# Patient Record
Sex: Male | Born: 2000 | Race: White | Hispanic: No | Marital: Single | Smoking: Never smoker
Health system: Southern US, Community
[De-identification: ages and names within clinical notes are randomized; demographics above are authoritative.]

## PROBLEM LIST (undated history)

## (undated) DIAGNOSIS — J45909 Unspecified asthma, uncomplicated: Secondary | ICD-10-CM

---
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
## 2021-01-03 ENCOUNTER — Encounter: Payer: Self-pay | Admitting: Emergency Medicine

## 2021-01-03 ENCOUNTER — Ambulatory Visit
Admission: EM | Admit: 2021-01-03 | Discharge: 2021-01-03 | Disposition: A | Discharge status: Home / Self Care | Payer: Medicaid Other | Attending: Internal Medicine | Admitting: Internal Medicine

## 2021-01-03 DIAGNOSIS — R200 Anesthesia of skin: Secondary | ICD-10-CM | POA: Diagnosis not present

## 2021-01-03 DIAGNOSIS — R202 Paresthesia of skin: Secondary | ICD-10-CM | POA: Diagnosis not present

## 2021-01-03 MED ORDER — METHYLPREDNISOLONE 4 MG PO TBPK
ORAL_TABLET | ORAL | 0 refills | Status: DC
Start: 2021-01-03 — End: 2021-01-07

## 2021-01-03 NOTE — Discharge Instructions (Addendum)
Avoid using your right hand on the mouse for the next week, and use the Left hand instead Follow up with your specialist next week, you may end up needing nerve conduction studies if it does not get better

## 2021-01-03 NOTE — ED Triage Notes (Signed)
Tingling and numbness in RT hand x 5 days.  Pt reports he works on his computer a lot.

## 2021-01-03 NOTE — ED Provider Notes (Signed)
[facility]    CSN: [id number] Arrival date & time: 01/03/21  1315      History   Chief Complaint No chief complaint on file.   HPI Todd [name] is a 20 y.o. male who presents with R small finger tingling and numbness x 5 days. States he has been using the mouse with this hand and working on a project using the computer for 10+ hours a day. He decided to come home last week and has been resting his R hand, and has noticed the numbness is not of the entire small finger, now is just the dorsal 1/2 distal digit.    Past Medical History:  Diagnosis Date  . Asthma     Patient Active Problem List   Diagnosis Date Noted  . WCC (well child check) 04/23/2015  . Allergic rhinitis 04/19/2013  . Mild persistent asthma 01/15/2009    Past Surgical History:  Procedure Laterality Date  . LAPAROSCOPIC APPENDECTOMY N/A 06/01/2017   Procedure: APPENDECTOMY LAPAROSCOPIC;  Surgeon: Kandice Hams, MD;  Location: [facility] OR;  Service: General;  Laterality: N/A;       Home Medications    Prior to Admission medications   Medication Sig Start Date End Date Taking? Authorizing Provider  methylPREDNISolone (MEDROL DOSEPAK) 4 MG TBPK tablet Take as directed 01/03/21  Yes Rodriguez-Southworth, Nettie Elm, PA-C  albuterol (PROAIR HFA) 108 (90 Base) MCG/ACT inhaler INHALE 2 PUFFS INTO THE LUNGS EVERY 4 (FOUR) HOURS AS NEEDED. FOR SEVERE COUGHING 09/20/20   Meccariello, Solmon Ice, DO  albuterol (PROVENTIL) (2.5 MG/3ML) 0.083% nebulizer solution Take 3 mLs (2.5 mg total) by nebulization every 6 (six) hours as needed for wheezing or shortness of breath. 06/14/19   Oralia Manis, DO  cephALEXin (KEFLEX) 500 MG capsule Take 1 capsule (500 mg total) by mouth 3 (three) times daily. 04/10/20   Blane Ohara, MD  cetirizine (ZYRTEC ALLERGY) 10 MG tablet Take 1 tablet (10 mg total) by mouth daily. 04/06/20   Avegno, Zachery Dakins, FNP  fluticasone (FLOVENT HFA) 44 MCG/ACT inhaler Inhale 2 puffs into the  lungs 2 (two) times daily. 06/14/19   Oralia Manis, DO  ibuprofen (ADVIL,MOTRIN) 800 MG tablet Take 1 tablet (800 mg total) by mouth 3 (three) times daily. Patient not taking: Reported on 04/07/2018 03/29/18   Gilda Crease, MD  Spacer/Aero-Holding Chambers (AEROCHAMBER PLUS) inhaler Use as instructed 08/02/13   Smitty Cords, DO    Family History Family History  Family history unknown: Yes    Social History Social History   Tobacco Use  . Smoking status: Never Smoker  . Smokeless tobacco: Never Used  Substance Use Topics  . Alcohol use: No  . Drug use: No     Allergies   Patient has no known allergies.   Review of Systems Review of Systems  Constitutional: Positive for activity change.  Musculoskeletal: Negative for arthralgias, joint swelling, neck pain and neck stiffness.  Skin: Negative for rash and wound.  Neurological: Positive for numbness.     Physical Exam Triage Vital Signs ED Triage Vitals  Enc Vitals Group     BP 01/03/21 1324 125/65     Pulse Rate 01/03/21 1324 83     Resp 01/03/21 1324 18     Temp 01/03/21 1324 98.2 F (36.8 C)     Temp Source 01/03/21 1324 Oral     SpO2 01/03/21 1324 97 %     Weight --      Height --  Head Circumference --      Peak Flow --      Pain Score 01/03/21 1327 3     Pain Loc --      Pain Edu? --      Excl. in GC? --    No data found.  Updated Vital Signs BP 125/65   Pulse 83   Temp 98.2 F (36.8 C) (Oral)   Resp 18   SpO2 97%   Visual Acuity Right Eye Distance:   Left Eye Distance:   Bilateral Distance:    Right Eye Near:   Left Eye Near:    Bilateral Near:     Physical Exam Vitals and nursing note reviewed.  Constitutional:      General: He is not in acute distress.    Appearance: He is normal weight. He is not toxic-appearing.  HENT:     Head: Normocephalic.     Right Ear: External ear normal.     Left Ear: External ear normal.  Eyes:     General: No scleral  icterus.    Conjunctiva/sclera: Conjunctivae normal.  Cardiovascular:     Pulses: Normal pulses.  Pulmonary:     Effort: Pulmonary effort is normal.  Musculoskeletal:        General: Normal range of motion.     Cervical back: Neck supple.     Comments: R ELBOW- not tender on lateral area or ulnar nerve region.  R HAND- normal ROM and grip strength 5/5  Skin:    General: Skin is warm and dry.     Capillary Refill: Capillary refill takes less than 2 seconds.     Findings: No rash.     Comments: Has normal sensation to light touch of his R small finger  Neurological:     Mental Status: He is alert and oriented to person, place, and time.     Gait: Gait normal.     Deep Tendon Reflexes: Reflexes normal.  Psychiatric:        Mood and Affect: Mood normal.        Behavior: Behavior normal.        Thought Content: Thought content normal.        Judgment: Judgment normal.      UC Treatments / Results  Labs (all labs ordered are listed, but only abnormal results are displayed) Labs Reviewed - No data to display  EKG   Radiology No results found.  Procedures Procedures (including critical care time)  Medications Ordered in UC Medications - No data to display  Initial Impression / Assessment and Plan / UC Course  I have reviewed the triage vital signs and the nursing notes. R small finger paresthesia possibly from over use. I placed him on a wrist splint to rest his wrist from moving laterally so much as he uses the mouse, but strongly advised to use R hand. I will have him try Medrol dose pack and see if this helps resolve the paresthesia.  Needs to FU with PCP next week.    Final Clinical Impressions(s) / UC Diagnoses   Final diagnoses:  Paresthesia of finger     Discharge Instructions     Avoid using your right hand on the mouse for the next week, and use the Left hand instead Follow up with your specialist next week, you may end up needing nerve conduction studies  if it does not get better    ED Prescriptions    Medication Sig Dispense Auth. Provider  methylPREDNISolone (MEDROL DOSEPAK) 4 MG TBPK tablet Take as directed 21 tablet Rodriguez-Southworth, Nettie Elm, PA-C     PDMP not reviewed this encounter.   Garey Ham, PA-C 01/03/21 1434

## 2021-01-07 ENCOUNTER — Telehealth: Payer: Self-pay | Admitting: Emergency Medicine

## 2021-01-07 MED ORDER — METHYLPREDNISOLONE 4 MG PO TBPK
ORAL_TABLET | ORAL | 0 refills | Status: DC
Start: 2021-01-07 — End: 2021-03-08

## 2021-03-08 ENCOUNTER — Encounter: Payer: Self-pay | Admitting: Emergency Medicine

## 2021-03-08 ENCOUNTER — Other Ambulatory Visit: Payer: Self-pay

## 2021-03-08 ENCOUNTER — Ambulatory Visit
Admission: EM | Admit: 2021-03-08 | Discharge: 2021-03-08 | Disposition: A | Discharge status: Home / Self Care | Payer: Medicaid Other | Attending: Emergency Medicine | Admitting: Emergency Medicine

## 2021-03-08 DIAGNOSIS — S0502XA Injury of conjunctiva and corneal abrasion without foreign body, left eye, initial encounter: Secondary | ICD-10-CM | POA: Diagnosis not present

## 2021-03-08 DIAGNOSIS — H18892 Other specified disorders of cornea, left eye: Secondary | ICD-10-CM

## 2021-03-08 MED ORDER — POLYMYXIN B-TRIMETHOPRIM 10000-0.1 UNIT/ML-% OP SOLN
1.0000 [drp] | Freq: Four times a day (QID) | OPHTHALMIC | 0 refills | Status: AC
Start: 2021-03-08 — End: 2021-03-15

## 2021-03-08 MED ORDER — SYSTANE OVERNIGHT THERAPY 0.3 % OP GEL: Freq: Every evening | OPHTHALMIC | 0 refills | Status: AC | PRN

## 2021-03-08 NOTE — Discharge Instructions (Addendum)
Use polytrim eye drops as prescribed and to completion Use sustane eye drops as needed for discomfort Use OTC ibuprofen or tylenol as needed for pain relief Follow up with ophthamolgy if symptoms persists or worsen such as fever, chills, redness, swelling, eye pain, painful eye movements, vision changes, etc..Marland Kitchen

## 2021-03-08 NOTE — ED Triage Notes (Signed)
Left eye redness since last night.  Eye watering.

## 2021-03-08 NOTE — ED Provider Notes (Signed)
[facility]   [id number] 03/08/21 Arrival Time: 1903  CC: Red eye  SUBJECTIVE:  Todd [name] is a 20 y.o. male who presents with complaint of eye redness, irritation and FB sensation x 1 day.  Reports black dot in eye and tried flushing and removing with finger/ wet paper towel without relief.  Symptoms are made worse with trying to remove.  Denies similar symptoms in the past.  Denies fever, chills, nausea, vomiting, eye pain, painful eye movements, itching, vision changes, periorbital erythema.     Denies contact lens use.    ROS: As per HPI.  All other pertinent ROS negative.     Past Medical History:  Diagnosis Date   Asthma    Past Surgical History:  Procedure Laterality Date   LAPAROSCOPIC APPENDECTOMY N/A 06/01/2017   Procedure: APPENDECTOMY LAPAROSCOPIC;  Surgeon: Kandice Hams, MD;  Location: [facility] OR;  Service: General;  Laterality: N/A;   No Known Allergies No current facility-administered medications on file prior to encounter.   Current Outpatient Medications on File Prior to Encounter  Medication Sig Dispense Refill   albuterol (PROAIR HFA) 108 (90 Base) MCG/ACT inhaler INHALE 2 PUFFS INTO THE LUNGS EVERY 4 (FOUR) HOURS AS NEEDED. FOR SEVERE COUGHING 18 g 2   albuterol (PROVENTIL) (2.5 MG/3ML) 0.083% nebulizer solution Take 3 mLs (2.5 mg total) by nebulization every 6 (six) hours as needed for wheezing or shortness of breath. 150 mL 1   cetirizine (ZYRTEC ALLERGY) 10 MG tablet Take 1 tablet (10 mg total) by mouth daily. 30 tablet 0   ibuprofen (ADVIL,MOTRIN) 800 MG tablet Take 1 tablet (800 mg total) by mouth 3 (three) times daily. (Patient not taking: Reported on 04/07/2018) 21 tablet 0   Spacer/Aero-Holding Chambers (AEROCHAMBER PLUS) inhaler Use as instructed 2 each 1   Social History   Socioeconomic History   Marital status: Single    Spouse name: Not on file   Number of children: Not on file   Years of education: Not on file   Highest education  level: Not on file  Occupational History   Not on file  Tobacco Use   Smoking status: Never   Smokeless tobacco: Never  Substance and Sexual Activity   Alcohol use: No   Drug use: No   Sexual activity: Not on file  Other Topics Concern   Not on file  Social History Narrative   Not on file   Social Determinants of Health   Financial Resource Strain: Not on file  Food Insecurity: Not on file  Transportation Needs: Not on file  Physical Activity: Not on file  Stress: Not on file  Social Connections: Not on file  Intimate Partner Violence: Not on file   Family History  Problem Relation Age of Onset   Healthy Mother    Healthy Father     OBJECTIVE:   Vitals:   03/08/21 1933  BP: 127/73  Pulse: 83  Resp: 16  Temp: 98.4 F (36.9 C)  TempSrc: Oral  SpO2: 97%    General appearance: alert; no distress HENT: NCAT Eyes: Trace LT conjunctival erythema. PERRL; EOMI without discomfort;  no obvious drainage; black dot in 7 o'clock position Nose: nares patent Neck: supple Lungs: normal respiratory effort Skin: warm and dry Psychological: alert and cooperative; normal mood and affect   ASSESSMENT & PLAN:  1. Abrasion of left cornea, initial encounter   2. Corneal irritation of left eye     Meds ordered this encounter  Medications  trimethoprim-polymyxin b (POLYTRIM) ophthalmic solution    Sig: Place 1 drop into the left eye 4 (four) times daily for 7 days.    Dispense:  10 mL    Refill:  0    Order Specific Question:   Supervising Provider    Answer:   Eustace Moore [[id number]]   hypromellose (SYSTANE OVERNIGHT THERAPY) 0.3 % GEL ophthalmic ointment    Sig: Place into the left eye at bedtime as needed for dry eyes.    Dispense:  10 g    Refill:  0    Order Specific Question:   Supervising Provider    Answer:   Eustace Moore [[id number]]    Use polytrim eye drops as prescribed and to completion Use sustane eye drops as needed for discomfort Use OTC  ibuprofen or tylenol as needed for pain relief Follow up with ophthamolgy if symptoms persists or worsen such as fever, chills, redness, swelling, eye pain, painful eye movements, vision changes, etc...   Reviewed expectations re: course of current medical issues. Questions answered. Outlined signs and symptoms indicating need for more acute intervention. Patient verbalized understanding. After Visit Summary given.    Rennis Harding, PA-C 03/08/21 1948

## 2021-03-12 DIAGNOSIS — T1502XA Foreign body in cornea, left eye, initial encounter: Secondary | ICD-10-CM | POA: Diagnosis not present

## 2021-03-12 DIAGNOSIS — H5712 Ocular pain, left eye: Secondary | ICD-10-CM | POA: Diagnosis not present

## 2021-04-14 ENCOUNTER — Other Ambulatory Visit: Payer: Self-pay

## 2021-04-14 ENCOUNTER — Emergency Department (HOSPITAL_COMMUNITY): Payer: Medicaid Other

## 2021-04-14 ENCOUNTER — Emergency Department (HOSPITAL_COMMUNITY)
Admission: EM | Admit: 2021-04-14 | Discharge: 2021-04-14 | Disposition: A | Discharge status: Home / Self Care | Payer: Medicaid Other | Attending: Emergency Medicine | Admitting: Emergency Medicine

## 2021-04-14 DIAGNOSIS — M79671 Pain in right foot: Secondary | ICD-10-CM | POA: Diagnosis not present

## 2021-04-14 DIAGNOSIS — J4530 Mild persistent asthma, uncomplicated: Secondary | ICD-10-CM | POA: Diagnosis not present

## 2021-04-14 DIAGNOSIS — M25571 Pain in right ankle and joints of right foot: Secondary | ICD-10-CM

## 2021-04-14 DIAGNOSIS — R2241 Localized swelling, mass and lump, right lower limb: Secondary | ICD-10-CM | POA: Insufficient documentation

## 2021-04-14 DIAGNOSIS — Y9367 Activity, basketball: Secondary | ICD-10-CM | POA: Diagnosis not present

## 2021-04-14 DIAGNOSIS — M7989 Other specified soft tissue disorders: Secondary | ICD-10-CM | POA: Diagnosis not present

## 2021-04-14 NOTE — ED Provider Notes (Signed)
[facility] EMERGENCY DEPARTMENT Provider Note   CSN: [id number] Arrival date & time: 04/14/21  2115     History Chief Complaint  Patient presents with   Ankle Pain    Right ankle rolled while playing basketball    Todd [name] is a 20 y.o. male.  HPI  Patient with no significant medical history presents to the emergency department with chief complaint of right ankle pain.  Patient states today while he was playing basketball he went up for a jump ball landed on unlevel ground and inverted his ankle.  He states he heard a popping like noise and had severe pain in his right ankle.  He states when he applies pressure on his foot he has pain, he states he has intermittent paresthesias in his toes, is able to wiggle his toes, and slightly flex and extend at his ankle but it hurts when he does so.  He denies hitting his head, losing conscious, is not on anticoagulant.  He has not taken anything for his pain, he denies alleviating factors.  He has never had this happen to him in the past.  He does not endorse fevers, chills, chest pain, shortness of breath, abdominal pain, pedal edema.  Past Medical History:  Diagnosis Date   Asthma     Patient Active Problem List   Diagnosis Date Noted   WCC (well child check) 04/23/2015   Allergic rhinitis 04/19/2013   Mild persistent asthma 01/15/2009    Past Surgical History:  Procedure Laterality Date   LAPAROSCOPIC APPENDECTOMY N/A 06/01/2017   Procedure: APPENDECTOMY LAPAROSCOPIC;  Surgeon: Kandice Hams, MD;  Location: [facility];  Service: General;  Laterality: N/A;       Family History  Problem Relation Age of Onset   Healthy Mother    Healthy Father     Social History   Tobacco Use   Smoking status: Never   Smokeless tobacco: Never  Substance Use Topics   Alcohol use: No   Drug use: No    Home Medications Prior to Admission medications   Medication Sig Start Date End Date Taking? Authorizing Provider  albuterol (PROAIR  HFA) 108 (90 Base) MCG/ACT inhaler INHALE 2 PUFFS INTO THE LUNGS EVERY 4 (FOUR) HOURS AS NEEDED. FOR SEVERE COUGHING 09/20/20   Meccariello, Solmon Ice, DO  albuterol (PROVENTIL) (2.5 MG/3ML) 0.083% nebulizer solution Take 3 mLs (2.5 mg total) by nebulization every 6 (six) hours as needed for wheezing or shortness of breath. 06/14/19   Oralia Manis, DO  cetirizine (ZYRTEC ALLERGY) 10 MG tablet Take 1 tablet (10 mg total) by mouth daily. 04/06/20   Avegno, Zachery Dakins, FNP  hypromellose (SYSTANE OVERNIGHT THERAPY) 0.3 % GEL ophthalmic ointment Place into the left eye at bedtime as needed for dry eyes. 03/08/21   Wurst, [place], PA-C  ibuprofen (ADVIL,MOTRIN) 800 MG tablet Take 1 tablet (800 mg total) by mouth 3 (three) times daily. Patient not taking: Reported on 04/07/2018 03/29/18   Gilda Crease, MD  Spacer/Aero-Holding Chambers (AEROCHAMBER PLUS) inhaler Use as instructed 08/02/13   Smitty Cords, DO    Allergies    Patient has no known allergies.  Review of Systems   Review of Systems  Constitutional:  Negative for chills and fever.  HENT:  Negative for congestion.   Respiratory:  Negative for shortness of breath.   Cardiovascular:  Negative for chest pain.  Gastrointestinal:  Negative for abdominal pain.  Genitourinary:  Negative for enuresis.  Musculoskeletal:  Negative for back  pain.       Right ankle pain.  Skin:  Negative for rash.  Neurological:  Negative for dizziness.  Hematological:  Does not bruise/bleed easily.   Physical Exam Updated Vital Signs BP 127/78   Pulse 95   Temp 99 F (37.2 C) (Oral)   Resp 16   Ht 6\' 1"  (1.854 m)   Wt 86.2 kg   SpO2 98%   BMI 25.07 kg/m   Physical Exam Vitals and nursing note reviewed.  Constitutional:      General: He is not in acute distress.    Appearance: Normal appearance. He is not ill-appearing or diaphoretic.  HENT:     Head: Normocephalic and atraumatic.     Nose: No congestion.  Eyes:      Conjunctiva/sclera: Conjunctivae normal.  Cardiovascular:     Rate and Rhythm: Normal rate and regular rhythm.  Pulmonary:     Effort: Pulmonary effort is normal.     Breath sounds: Normal breath sounds.  Musculoskeletal:        General: No deformity.     Cervical back: Neck supple.     Right lower leg: Edema present.     Left lower leg: No edema.     Comments: Patient's right ankle is visualized there is no gross deformities present, he has noted edema around the lateral malleolus, he has full range of motion at his toes, limited range of motion with flexion and extension at the ankle likely due to pain, he had slight tenderness to palpation on the proximal end of the third and fourth metatarsals, no gross deformities present, he is tender on his lateral malleolus no gross deformities present, Achilles tendon was fully intact nontender to palpation.  Skin:    General: Skin is warm and dry.     Coloration: Skin is not jaundiced or pale.  Neurological:     Mental Status: He is alert and oriented to person, place, and time.  Psychiatric:        Mood and Affect: Mood normal.    ED Results / Procedures / Treatments   Labs (all labs ordered are listed, but only abnormal results are displayed) Labs Reviewed - No data to display  EKG None  Radiology DG Ankle Complete Right  Result Date: 04/14/2021 CLINICAL DATA:  Patient rolled/inverted his right ankle tonight. Unable to bear weight. EXAM: RIGHT ANKLE - COMPLETE 3+ VIEW COMPARISON:  None. FINDINGS: No fracture or bone lesion. Ankle joint normally spaced and aligned. Lateral soft tissue swelling. IMPRESSION: 1. No fracture or dislocation. Electronically Signed   By: 04/16/2021 M.D.   On: 04/14/2021 22:32   DG Foot Complete Right  Result Date: 04/14/2021 CLINICAL DATA:  Third and fourth metatarsal pain after rolling ankle. EXAM: RIGHT FOOT COMPLETE - 3+ VIEW COMPARISON:  Contemporary ankle radiograph 04/14/2021 FINDINGS: No clear acute  fracture or traumatic osseous injury is identified. No gross traumatic malalignment within the limitations of a nonweightbearing exam. Well corticated accessory navicular. Lateral ankle swelling is seen, better assessed on dedicated radiographs of the ankle performed the same day. No other soft tissue abnormality. No soft tissue gas or foreign body. IMPRESSION: No clear acute fracture or traumatic malalignment within the limitations of a nonweightbearing exam. Lateral ankle swelling. Electronically Signed   By: 04/16/2021 M.D.   On: 04/14/2021 23:05    Procedures Procedures   Medications Ordered in ED Medications - No data to display  ED Course  I have reviewed the triage  vital signs and the nursing notes.  Pertinent labs & imaging results that were available during my care of the patient were reviewed by me and considered in my medical decision making (see chart for details).    MDM Rules/Calculators/A&P                          Initial impression-patient presents with right ankle pain.  He is alert, does not appear in distress, vital signs reassuring.  Will obtain imaging of his foot and ankle.  Work-up-x-ray of foot is unremarkable, DG foot negative for acute findings.  Rule out- I have low suspicion for septic arthritis as patient denies IV drug use, skin exam was performed no erythematous, edematous, warm joints noted on exam, no new heart murmur heard on exam.  Low suspicion for fracture or dislocation as x-ray does not feel any significant findings. low suspicion for ligament or tendon damage as area was palpated no gross defects noted, they had full range of motion as well as 5/5 strength.  Low suspicion for compartment syndrome as area was palpated it was soft to the touch, neurovascular fully intact.  Plan- Right ankle pain-suspect this is a muscular strain, possibly could be ligament damage will place him in a brace, make him nonweightbearing, follow-up with orthopedic surgery  for further evaluation.  Vital signs have remained stable, no indication for hospital admission.   Patient given at home care as well strict return precautions.  Patient verbalized that they understood agreed to said plan.  Final Clinical Impression(s) / ED Diagnoses Final diagnoses:  Acute right ankle pain    Rx / DC Orders ED Discharge Orders     None        Barnie Del 04/14/21 2335    Bethann Berkshire, MD 04/15/21 1042

## 2021-04-14 NOTE — Discharge Instructions (Addendum)
Imaging was negative, I placed you in a brace please wear during the day you may take off at nighttime.  Please use crutches as I do not want to be weightbearing.  Recommend over-the-counter pain medications.  Please keep the foot elevated, apply ice to area to help with swelling.  Please follow-up with orthopedic surgery for further evaluation.  Come back to the emergency department if you develop chest pain, shortness of breath, severe abdominal pain, uncontrolled nausea, vomiting, diarrhea.

## 2021-04-15 ENCOUNTER — Telehealth: Payer: Self-pay

## 2021-04-15 DIAGNOSIS — M25571 Pain in right ankle and joints of right foot: Secondary | ICD-10-CM | POA: Diagnosis not present

## 2021-04-15 NOTE — Telephone Encounter (Signed)
Transition Care Management Follow-up Telephone Call Date of discharge and from where: 04/14/2021[facility]  How have you been since you were released from the hospital? Pain this morning but Tylenol helps.  Any questions or concerns? No  Items Reviewed: Did the pt receive and understand the discharge instructions provided? Yes  Medications obtained and verified?  No medications given at discharge.  Other? No  Any new allergies since your discharge? No  Dietary orders reviewed? No Do you have support at home? Yes   Home Care and Equipment/Supplies: Were home health services ordered? not applicable If so, what is the name of the agency? N/A  Has the agency set up a time to come to the patient's home? not applicable Were any new equipment or medical supplies ordered?  No What is the name of the medical supply agency? N/A Were you able to get the supplies/equipment? not applicable Do you have any questions related to the use of the equipment or supplies? No  Functional Questionnaire: (I = Independent and D = Dependent) ADLs: I  Bathing/Dressing- I  Meal Prep- I  Eating- I  Maintaining continence- I  Transferring/Ambulation- I  Managing Meds- I  Follow up appointments reviewed:  PCP Hospital f/u appt confirmed? No   Specialist Hospital f/u appt confirmed? No  Patient will schedule with Dr. Romeo Apple. Are transportation arrangements needed? No  If their condition worsens, is the pt aware to call PCP or go to the Emergency Dept.? Yes Was the patient provided with contact information for the PCP's office or ED? Yes Was to pt encouraged to call back with questions or concerns? Yes

## 2021-04-16 DIAGNOSIS — M25571 Pain in right ankle and joints of right foot: Secondary | ICD-10-CM | POA: Diagnosis not present

## 2021-05-21 DIAGNOSIS — M25571 Pain in right ankle and joints of right foot: Secondary | ICD-10-CM | POA: Diagnosis not present

## 2021-05-27 ENCOUNTER — Other Ambulatory Visit: Payer: Self-pay

## 2021-05-27 ENCOUNTER — Encounter: Payer: Self-pay | Admitting: Emergency Medicine

## 2021-05-27 ENCOUNTER — Ambulatory Visit
Admission: EM | Admit: 2021-05-27 | Discharge: 2021-05-27 | Disposition: A | Discharge status: Home / Self Care | Payer: Medicaid Other | Attending: Family Medicine | Admitting: Family Medicine

## 2021-05-27 DIAGNOSIS — H60391 Other infective otitis externa, right ear: Secondary | ICD-10-CM

## 2021-05-27 MED ORDER — NEOMYCIN-POLYMYXIN-HC 3.5-10000-1 OT SUSP: 3.0000 [drp] | Freq: Three times a day (TID) | OTIC | 0 refills | Status: AC

## 2021-05-27 NOTE — ED Provider Notes (Signed)
[facility]    CSN: [id number] Arrival date & time: 05/27/21  1858      History   Chief Complaint No chief complaint on file.   HPI Todd [name] is a 20 y.o. male.   HPI  Patient in today with right ear pain following tubing times a few days ago.  Patient is afebrile.  Endorses no associated URI symptoms. No history of recurrent ear infections. Past Medical History:  Diagnosis Date   Asthma     Patient Active Problem List   Diagnosis Date Noted   WCC (well child check) 04/23/2015   Allergic rhinitis 04/19/2013   Mild persistent asthma 01/15/2009    Past Surgical History:  Procedure Laterality Date   LAPAROSCOPIC APPENDECTOMY N/A 06/01/2017   Procedure: APPENDECTOMY LAPAROSCOPIC;  Surgeon: Kandice Hams, MD;  Location: [facility] OR;  Service: General;  Laterality: N/A;       Home Medications    Prior to Admission medications   Medication Sig Start Date End Date Taking? Authorizing Provider  albuterol (PROAIR HFA) 108 (90 Base) MCG/ACT inhaler INHALE 2 PUFFS INTO THE LUNGS EVERY 4 (FOUR) HOURS AS NEEDED. FOR SEVERE COUGHING 09/20/20   Meccariello, Solmon Ice, DO  albuterol (PROVENTIL) (2.5 MG/3ML) 0.083% nebulizer solution Take 3 mLs (2.5 mg total) by nebulization every 6 (six) hours as needed for wheezing or shortness of breath. 06/14/19   Oralia Manis, DO  cetirizine (ZYRTEC ALLERGY) 10 MG tablet Take 1 tablet (10 mg total) by mouth daily. 04/06/20   Avegno, Zachery Dakins, FNP  hypromellose (SYSTANE OVERNIGHT THERAPY) 0.3 % GEL ophthalmic ointment Place into the left eye at bedtime as needed for dry eyes. 03/08/21   Wurst, [place], PA-C  ibuprofen (ADVIL,MOTRIN) 800 MG tablet Take 1 tablet (800 mg total) by mouth 3 (three) times daily. Patient not taking: Reported on 04/07/2018 03/29/18   Gilda Crease, MD  Spacer/Aero-Holding Chambers (AEROCHAMBER PLUS) inhaler Use as instructed 08/02/13   Smitty Cords, DO    Family History Family History   Problem Relation Age of Onset   Healthy Mother    Healthy Father     Social History Social History   Tobacco Use   Smoking status: Never   Smokeless tobacco: Never  Substance Use Topics   Alcohol use: No   Drug use: No     Allergies   Patient has no known allergies.   Review of Systems Review of Systems Pertinent negatives listed in HPI   Physical Exam Triage Vital Signs ED Triage Vitals [05/27/21 1903]  Enc Vitals Group     BP 136/84     Pulse Rate 85     Resp 16     Temp 98.6 F (37 C)     Temp Source Oral     SpO2 96 %     Weight      Height      Head Circumference      Peak Flow      Pain Score 8     Pain Loc      Pain Edu?      Excl. in GC?    No data found.  Updated Vital Signs BP 136/84 (BP Location: Right Arm)   Pulse 85   Temp 98.6 F (37 C) (Oral)   Resp 16   SpO2 96%   Visual Acuity Right Eye Distance:   Left Eye Distance:   Bilateral Distance:    Right Eye Near:   Left Eye Near:  Bilateral Near:     Physical Exam  General Appearance:    Alert, cooperative, no distress  HENT:  Normocephalic, left ear tragus tenderness with erythema,   right ear normal, no neck nodes or sinus tenderness  Eyes:    PERRL, conjunctiva/corneas clear, EOM's intact       Lungs:     Clear to auscultation bilaterally, respirations unlabored  Heart:    Regular rate and rhythm  Neurologic:   Awake, alert, oriented x 3. No apparent focal neurological           defect.        UC Treatments / Results  Labs (all labs ordered are listed, but only abnormal results are displayed) Labs Reviewed - No data to display  EKG   Radiology No results found.  Procedures Procedures (including critical care time)  Medications Ordered in UC Medications - No data to display  Initial Impression / Assessment and Plan / UC Course  I have reviewed the triage vital signs and the nursing notes.  Pertinent labs & imaging results that were available during my  care of the patient were reviewed by me and considered in my medical decision making (see chart for details).     Infective otitis externa right ear  Treatment per discharge medication orders. RTC PRN Final Clinical Impressions(s) / UC Diagnoses   Final diagnoses:  Infective otitis externa of right ear   Discharge Instructions   None    ED Prescriptions     Medication Sig Dispense Auth. Provider   neomycin-polymyxin-hydrocortisone (CORTISPORIN) 3.5-10000-1 OTIC suspension Place 3 drops into the right ear 3 (three) times daily for 7 days. 5 mL Bing Neighbors, FNP      PDMP not reviewed this encounter.   Bing Neighbors, FNP 05/27/21 1924

## 2021-05-27 NOTE — ED Triage Notes (Signed)
Right ear pain after tubing in water today.

## 2021-06-29 NOTE — Progress Notes (Signed)
    Routine Well Visit  PCP: Sabino Dick, DO   History was provided by the patient.  Todd [name] is a 20 y.o. male who is here for a routine physical examination.  Current concerns: Would like Flu Shot   Adolescent Assessment:  Confidentiality was discussed with the patient and if applicable, with caregiver as well.  Home and Environment:  Lives with: lives at home with his mother, father and younger sister. Parental relations: Good Friends/Peers: Good group of friends Nutrition/Eating Behaviors: Eats lots of protein. Balanced meals. Sports/Exercise:  Secondary school teacher for fun, gym.   Education and Employment:  School Status: not in school currently, taking a gap semester. Goes to [employer] and majoring in Media planner. School History: School attendance is regular. Work: Regulatory affairs officer, Chartered certified accountant business, manages a [employer], builds houses, [employer] Activities: Listens to Acupuncturist.  Patient reports being comfortable and safe at school and at home? Yes  Smoking: no Secondhand smoke exposure? no Drugs/EtOH: None   Sexuality:  - sexual partners in last year:  0 - contraception use:  Not sexually active yet but does have condoms when ready. - Last STI Screening: No - Violence/Abuse: No  Mood: Suicidality and Depression: None, feels happy.  Weapons: Has guns at home but they are locked in a gun safe.  PHQ-9 completed and results indicated: 0  Physical Exam:  BP 133/79   Pulse 90   Ht 6\' 1"  (1.854 m)   Wt 196 lb (88.9 kg)   SpO2 100%   BMI 25.86 kg/m  Blood pressure percentiles are not available for patients who are 18 years or older.   General Appearance:   alert, oriented, no acute distress and well nourished  HENT: Normocephalic, no obvious abnormality, PERRL, EOM's intact, conjunctiva clear  Mouth:   Normal appearing teeth, no obvious discoloration, dental caries, or dental caps  Neck:   Supple; thyroid:  no enlargement, symmetric, no tenderness/mass/nodules  Lungs:   Clear to auscultation bilaterally, normal work of breathing  Heart:   Regular rate and rhythm, S1 and S2 normal, no murmurs;   Abdomen:   Soft, non-tender, no mass, or organomegaly  GU genitalia not examined  Musculoskeletal:   Tone and strength strong and symmetrical, all extremities               Lymphatic:   No cervical adenopathy  Skin/Hair/Nails:   Skin warm, dry and intact, no rashes, no bruises or petechiae  Neurologic:   Strength, gait, and coordination normal and age-appropriate    Assessment/Plan: Todd [name] is a healthy 20 y.o. male who presents today for annual examination. Examination without abnormalities. BMI is 25 which is in the overweight category but patient is very active and has been lifting weights and trying to develop more muscle mass. His blood pressure was mildly elevated at 133/79 today. We discussed lowering salt content in food, continuing to exercise and monitoring his pressures at home with his mother's blood pressure cuff. This was likely more-so related to nervousness in the office. Can monitor for now and focus on lifestyle interventions as that would be first-line treatment anyway.  Immunizations today: Flu History of previous adverse reactions to immunizations? No  - Follow-up visit in 1 year for next visit, or sooner as needed.   12, DO

## 2021-07-02 ENCOUNTER — Encounter: Payer: Self-pay | Admitting: Family Medicine

## 2021-07-02 ENCOUNTER — Other Ambulatory Visit: Payer: Self-pay

## 2021-07-02 ENCOUNTER — Ambulatory Visit (INDEPENDENT_AMBULATORY_CARE_PROVIDER_SITE_OTHER): Payer: Medicaid Other | Admitting: Family Medicine

## 2021-07-02 VITALS — BP 133/79 | HR 90 | Ht 73.0 in | Wt 196.0 lb

## 2021-07-02 DIAGNOSIS — Z23 Encounter for immunization: Secondary | ICD-10-CM

## 2021-07-02 DIAGNOSIS — Z0000 Encounter for general adult medical examination without abnormal findings: Secondary | ICD-10-CM

## 2021-07-02 NOTE — Patient Instructions (Signed)
It was wonderful to see you today.  Please bring ALL of your medications with you to every visit.   Today we talked about:  -You received your Flu shot today. -Check your blood pressure at home. If it is persistently elevated >130/80, let us know. The best thing you can do for this is continue to exercise and limit the salt in your diet. -Today at your annual preventive visit we talked about the following measures: I recommend 150 minutes of exercise per week-try 30 minutes 5 days per week We discussed reducing sugary beverages (like soda and juice) and increasing leafy greens and whole fruits.  We discussed avoiding tobacco and alcohol.  I recommend avoiding illicit substances.    Thank you for choosing [facility].   Please call [phone] with any questions about today's appointment.  Please be sure to schedule follow up at the front  desk before you leave today.   Sabino Dick, DO PGY-2 Family Medicine

## 2021-08-01 ENCOUNTER — Other Ambulatory Visit: Payer: Self-pay

## 2021-08-01 ENCOUNTER — Encounter (HOSPITAL_BASED_OUTPATIENT_CLINIC_OR_DEPARTMENT_OTHER): Payer: Self-pay | Admitting: Emergency Medicine

## 2021-08-01 ENCOUNTER — Other Ambulatory Visit (HOSPITAL_BASED_OUTPATIENT_CLINIC_OR_DEPARTMENT_OTHER): Payer: Self-pay

## 2021-08-01 ENCOUNTER — Emergency Department (HOSPITAL_BASED_OUTPATIENT_CLINIC_OR_DEPARTMENT_OTHER)
Admission: EM | Admit: 2021-08-01 | Discharge: 2021-08-01 | Disposition: A | Discharge status: Home / Self Care | Payer: Medicaid Other | Attending: Emergency Medicine | Admitting: Emergency Medicine

## 2021-08-01 ENCOUNTER — Emergency Department (HOSPITAL_BASED_OUTPATIENT_CLINIC_OR_DEPARTMENT_OTHER): Payer: Medicaid Other

## 2021-08-01 DIAGNOSIS — Z20822 Contact with and (suspected) exposure to covid-19: Secondary | ICD-10-CM | POA: Diagnosis not present

## 2021-08-01 DIAGNOSIS — R410 Disorientation, unspecified: Secondary | ICD-10-CM | POA: Insufficient documentation

## 2021-08-01 DIAGNOSIS — J45909 Unspecified asthma, uncomplicated: Secondary | ICD-10-CM | POA: Diagnosis not present

## 2021-08-01 DIAGNOSIS — R0789 Other chest pain: Secondary | ICD-10-CM | POA: Diagnosis not present

## 2021-08-01 DIAGNOSIS — R079 Chest pain, unspecified: Secondary | ICD-10-CM | POA: Diagnosis not present

## 2021-08-01 LAB — RESP PANEL BY RT-PCR (FLU A&B, COVID) ARPGX2
Influenza A by PCR: NEGATIVE
Influenza B by PCR: NEGATIVE
SARS Coronavirus 2 by RT PCR: NEGATIVE

## 2021-08-01 MED ORDER — KETOROLAC TROMETHAMINE 30 MG/ML IJ SOLN
30.0000 mg | Freq: Once | INTRAMUSCULAR | Status: AC
  Administered 2021-08-01: 30 mg via INTRAMUSCULAR
  Filled 2021-08-01: qty 1

## 2021-08-01 MED ORDER — IBUPROFEN 600 MG PO TABS
600.0000 mg | ORAL_TABLET | Freq: Four times a day (QID) | ORAL | 0 refills | Status: AC | PRN
Start: 2021-08-01 — End: ?
  Filled 2021-08-01: qty 30, 8d supply, fill #0

## 2021-08-01 NOTE — ED Provider Notes (Signed)
[facility] EMERGENCY DEPARTMENT Provider Note   CSN: [id number] Arrival date & time: 08/01/21  4034     History Chief Complaint  Patient presents with   Chest Pain    Todd [name] is a 20 y.o. male.  Pt presents to the ED today with left sided cp.  It started around 0630 this am.  Pt denies any injury.  It hurts when he moves and when it is touched.  No other associated sx.      Past Medical History:  Diagnosis Date   Asthma     Patient Active Problem List   Diagnosis Date Noted   WCC (well child check) 04/23/2015   Allergic rhinitis 04/19/2013   Mild persistent asthma 01/15/2009    Past Surgical History:  Procedure Laterality Date   LAPAROSCOPIC APPENDECTOMY N/A 06/01/2017   Procedure: APPENDECTOMY LAPAROSCOPIC;  Surgeon: Kandice Hams, MD;  Location: [facility] OR;  Service: General;  Laterality: N/A;       Family History  Problem Relation Age of Onset   Healthy Mother    Healthy Father     Social History   Tobacco Use   Smoking status: Never   Smokeless tobacco: Never  Substance Use Topics   Alcohol use: No   Drug use: No    Home Medications Prior to Admission medications   Medication Sig Start Date End Date Taking? Authorizing Provider  ibuprofen (ADVIL) 600 MG tablet Take 1 tablet (600 mg total) by mouth every 6 (six) hours as needed. 08/01/21  Yes Jacalyn Lefevre, MD  albuterol (PROAIR HFA) 108 (90 Base) MCG/ACT inhaler INHALE 2 PUFFS INTO THE LUNGS EVERY 4 (FOUR) HOURS AS NEEDED. FOR SEVERE COUGHING 09/20/20   Meccariello, Solmon Ice, DO  albuterol (PROVENTIL) (2.5 MG/3ML) 0.083% nebulizer solution Take 3 mLs (2.5 mg total) by nebulization every 6 (six) hours as needed for wheezing or shortness of breath. Patient not taking: Reported on 07/02/2021 06/14/19   Oralia Manis, DO  cetirizine (ZYRTEC ALLERGY) 10 MG tablet Take 1 tablet (10 mg total) by mouth daily. Patient not taking: Reported on 07/02/2021 04/06/20   Durward Parcel, FNP   hypromellose (SYSTANE OVERNIGHT THERAPY) 0.3 % GEL ophthalmic ointment Place into the left eye at bedtime as needed for dry eyes. Patient not taking: Reported on 07/02/2021 03/08/21   Alvino Chapel [place], PA-C  Spacer/Aero-Holding Chambers (AEROCHAMBER PLUS) inhaler Use as instructed Patient not taking: Reported on 07/02/2021 08/02/13   Smitty Cords, DO    Allergies    Patient has no known allergies.  Review of Systems   Review of Systems  Cardiovascular:  Positive for chest pain.  All other systems reviewed and are negative.  Physical Exam Updated Vital Signs BP 139/60 (BP Location: Right Arm)   Pulse 94   Temp 98 F (36.7 C) (Oral)   Resp 14   Ht 6\' 1"  (1.854 m)   Wt 83.9 kg   SpO2 100%   BMI 24.41 kg/m   Physical Exam Vitals and nursing note reviewed.  Constitutional:      Appearance: He is well-developed.  HENT:     Head: Normocephalic and atraumatic.  Eyes:     Extraocular Movements: Extraocular movements intact.     Pupils: Pupils are equal, round, and reactive to light.  Cardiovascular:     Rate and Rhythm: Normal rate and regular rhythm.     Heart sounds: Normal heart sounds.  Pulmonary:     Effort: Pulmonary effort is normal.  Breath sounds: Normal breath sounds.  Chest:    Abdominal:     General: Bowel sounds are normal.     Palpations: Abdomen is soft.  Musculoskeletal:        General: Normal range of motion.     Cervical back: Normal range of motion and neck supple.  Skin:    General: Skin is warm.     Capillary Refill: Capillary refill takes less than 2 seconds.  Neurological:     Mental Status: He is alert. He is disoriented.  Psychiatric:        Mood and Affect: Mood normal.        Behavior: Behavior normal.    ED Results / Procedures / Treatments   Labs (all labs ordered are listed, but only abnormal results are displayed) Labs Reviewed  RESP PANEL BY RT-PCR (FLU A&B, COVID) [id number]    EKG EKG  Interpretation  Date/Time:  Thursday August 01 2021 08:32:40 EST Ventricular Rate:  80 PR Interval:  150 QRS Duration: 104 QT Interval:  340 QTC Calculation: 392 R Axis:   76 Text Interpretation: Normal sinus rhythm Normal ECG No old tracing to compare Confirmed by Jacalyn Lefevre [phone]) on 08/01/2021 9:17:49 AM  Radiology DG Chest 2 View  Result Date: 08/01/2021 CLINICAL DATA:  Left-sided chest pain beginning today upon wakening. EXAM: CHEST - 2 VIEW COMPARISON:  11/08/2018 FINDINGS: Heart size is normal. Mediastinal shadows are normal. The lungs are clear. No bronchial thickening. No infiltrate, mass, effusion or collapse. Pulmonary vascularity is normal. No bony abnormality. IMPRESSION: Normal chest Electronically Signed   By: Paulina Fusi M.D.   On: 08/01/2021 09:37    Procedures Procedures   Medications Ordered in ED Medications  ketorolac (TORADOL) 30 MG/ML injection 30 mg (30 mg Intramuscular Given 08/01/21 0948)    ED Course  I have reviewed the triage vital signs and the nursing notes.  Pertinent labs & imaging results that were available during my care of the patient were reviewed by me and considered in my medical decision making (see chart for details).    MDM Rules/Calculators/A&P                           Pt's pain is pleuritic.  He has a nl ekg and cxr.  Covid/flu neg.  Pt is stable for d/c.  Return if worse.  F/u with pcp. Final Clinical Impression(s) / ED Diagnoses Final diagnoses:  Chest wall pain    Rx / DC Orders ED Discharge Orders          Ordered    ibuprofen (ADVIL) 600 MG tablet  Every 6 hours PRN        08/01/21 1038             Jacalyn Lefevre, MD 08/01/21 1039

## 2021-08-01 NOTE — ED Triage Notes (Signed)
Pt arrivves pov with c/o radiating left side CP to left arm that started ~ 0630 today upon waking. Pt denies n/v, shob or cough. Denies injury.

## 2021-08-01 NOTE — ED Notes (Signed)
Patient transported to X-ray via wheelchair 

## 2021-08-01 NOTE — ED Notes (Signed)
Pt transported to XR.  

## 2021-08-02 ENCOUNTER — Telehealth: Payer: Self-pay

## 2021-08-02 NOTE — Telephone Encounter (Signed)
Transition Care Management Follow-up Telephone Call Date of discharge and from where: 08/01/2021-[facility] How have you been since you were released from the hospital? Patient stated he is doing fine.  Any questions or concerns? No  Items Reviewed: Did the pt receive and understand the discharge instructions provided? Yes  Medications obtained and verified? Yes  Other? No  Any new allergies since your discharge? No  Dietary orders reviewed? No Do you have support at home? Yes   Home Care and Equipment/Supplies: Were home health services ordered? not applicable If so, what is the name of the agency? N/A  Has the agency set up a time to come to the patient's home? not applicable Were any new equipment or medical supplies ordered?  No What is the name of the medical supply agency? N/A Were you able to get the supplies/equipment? not applicable Do you have any questions related to the use of the equipment or supplies? No  Functional Questionnaire: (I = Independent and D = Dependent) ADLs: I  Bathing/Dressing- I  Meal Prep- I  Eating- I  Maintaining continence- I  Transferring/Ambulation- I  Managing Meds- I  Follow up appointments reviewed:  PCP Hospital f/u appt confirmed? No   Specialist Hospital f/u appt confirmed? No   Are transportation arrangements needed? No  If their condition worsens, is the pt aware to call PCP or go to the Emergency Dept.? Yes Was the patient provided with contact information for the PCP's office or ED? Yes Was to pt encouraged to call back with questions or concerns? Yes

## 2021-08-12 ENCOUNTER — Other Ambulatory Visit (HOSPITAL_BASED_OUTPATIENT_CLINIC_OR_DEPARTMENT_OTHER): Payer: Self-pay

## 2022-02-25 ENCOUNTER — Encounter: Payer: Self-pay | Admitting: *Deleted

## 2022-09-09 ENCOUNTER — Other Ambulatory Visit: Payer: Self-pay

## 2022-09-09 DIAGNOSIS — J4531 Mild persistent asthma with (acute) exacerbation: Secondary | ICD-10-CM

## 2022-09-09 MED ORDER — ALBUTEROL SULFATE HFA 108 (90 BASE) MCG/ACT IN AERS: INHALATION_SPRAY | RESPIRATORY_TRACT | 2 refills | Status: DC

## 2023-02-10 ENCOUNTER — Encounter: Payer: Self-pay | Admitting: Family Medicine

## 2023-02-10 ENCOUNTER — Other Ambulatory Visit: Payer: Self-pay

## 2023-02-10 ENCOUNTER — Ambulatory Visit (INDEPENDENT_AMBULATORY_CARE_PROVIDER_SITE_OTHER): Payer: Medicaid Other | Admitting: Family Medicine

## 2023-02-10 VITALS — BP 129/60 | HR 77 | Ht 72.5 in | Wt 215.2 lb

## 2023-02-10 DIAGNOSIS — R0989 Other specified symptoms and signs involving the circulatory and respiratory systems: Secondary | ICD-10-CM | POA: Diagnosis not present

## 2023-02-10 DIAGNOSIS — J4531 Mild persistent asthma with (acute) exacerbation: Secondary | ICD-10-CM

## 2023-02-10 DIAGNOSIS — Z0000 Encounter for general adult medical examination without abnormal findings: Secondary | ICD-10-CM

## 2023-02-10 MED ORDER — ALBUTEROL SULFATE HFA 108 (90 BASE) MCG/ACT IN AERS: INHALATION_SPRAY | RESPIRATORY_TRACT | 2 refills | Status: AC

## 2023-02-10 MED ORDER — ALBUTEROL SULFATE (2.5 MG/3ML) 0.083% IN NEBU: 2.5000 mg | INHALATION_SOLUTION | Freq: Four times a day (QID) | RESPIRATORY_TRACT | 1 refills | Status: AC | PRN

## 2023-02-10 NOTE — Patient Instructions (Signed)
It was wonderful to see you today.  Please bring ALL of your medications with you to every visit.   Today we talked about:  Today at your annual preventive visit we talked about the following measures I recommend 150 minutes of exercise per week-try 30 minutes 5 days per week We discussed reducing sugary beverages (like soda and juice) and increasing leafy greens and whole fruits.  We discussed avoiding tobacco and alcohol.  I recommend avoiding illicit substances.  Your blood pressure is at goal of <140/90.   I have refilled your albuterol inhaler and nebulizer solution.  Thank you for coming to your visit as scheduled. We have had a large "no-show" problem lately, and this significantly limits our ability to see and care for patients. As a friendly reminder- if you cannot make your appointment please call to cancel. We do have a no show policy for those who do not cancel within 24 hours. Our policy is that if you miss or fail to cancel an appointment within 24 hours, 3 times in a 22-month period, you may be dismissed from our clinic.   Thank you for choosing [facility].   Please call [phone] with any questions about today's appointment.  Please be sure to schedule follow up at the front  desk before you leave today.   Sabino Dick, DO PGY-3 Family Medicine

## 2023-02-10 NOTE — Progress Notes (Signed)
SUBJECTIVE:   Chief compliant/HPI: annual examination  Todd [name] is a 22 y.o. who presents today for an annual exam.  Current concerns: would like refill on inhaler and nebulizer medication. Uses rarely but would like to have it on hand just in case.  At end of the visit he also stated that he lately has noticed that when the room is quiet he notices that he clears his voice often.  He is unsure if anything is wrong.  Only seems to happen during meetings when there is a silence. Feels slightly embarrassed by this.   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  Lives in his own house.  Denies drug use or alcohol.  Exercises for about an hour each morning, very active at his job.  Diet is well balanced.   Denies any family history.   OBJECTIVE:  BP 129/60   Pulse 77   Ht 6' 0.5" (1.842 m)   Wt 215 lb 4 oz (97.6 kg)   SpO2 99%   BMI 28.79 kg/m   General: Awake, alert, oriented, in no acute distress, pleasant and cooperative with examination HEENT: Normocephalic, atraumatic, nares patent, dentition is good, oropharynx without erythema or exudates, TM's clear bilaterally, no thyroid nodules palpated Cardio: RRR without murmur, 2+ radial, DP and PT pulses b/l Respiratory: CTAB without wheezing/rhonchi/rales Abdomen: Soft, non-tender to palpation of all quadrants, non-distended, no rebound/guarding, no organomegaly MSK: Able to move all extremities spontaneously, good muscle strength, no abnormalities Extremities: without edema or cyanosis Neuro: Speech is clear and intact, no focal deficits, no facial asymmetry, follows commands  Psych: Normal mood and affect     02/10/2023   12:22 PM 06/15/2020    4:02 PM 04/13/2020    9:09 AM  Depression screen PHQ 2/9  Decreased Interest 0 0 0  Down, Depressed, Hopeless 0 0 0  PHQ - 2 Score 0 0 0  Altered sleeping 0 0 0  Tired, decreased energy 0 0 0  Change in appetite 0 0 0  Feeling bad or failure about yourself  0 0 0   Trouble concentrating 0 0 0  Moving slowly or fidgety/restless 0 0 0  Suicidal thoughts 0 0 0  PHQ-9 Score 0 0 0  Difficult doing work/chores  Not difficult at all     ASSESSMENT/PLAN:   1. Mild persistent asthma with acute exacerbation Seems very well controlled. May be growing out of his asthma- we discussed this. Will refill for him to have on hand in case of exacerbation. - albuterol (PROAIR HFA) 108 (90 Base) MCG/ACT inhaler; INHALE 2 PUFFS INTO THE LUNGS EVERY 4 (FOUR) HOURS AS NEEDED. FOR SEVERE COUGHING  Dispense: 18 g; Refill: 2  2. Chronic Throat Clearing His throat clearing sounds like a nervous tic given he is able to pick out specific instances when it occurs and only seems to occur during moments of silence in a group setting.  Cardiopulmonary exam is unremarkable.  We discussed CBT to help, he is not interested at this time.  Advised him to let us know if he changes his mind in the future.  3. Annual physical exam See AVS for age appropriate recommendations  PHQ score 0, reviewed and discussed.  Blood pressure reviewed and at goal.     Considered the following items based upon USPSTF recommendations: HIV testing: not indicated Hepatitis C: not indicated Hepatitis B: not indicated Syphilis if at high risk: {not indicated GC/CTnot indicated Lipid panel (nonfasting or  fasting) discussed based upon AHA recommendations and not ordered.  Consider repeat every 4-6 years.  Reviewed risk factors for latent tuberculosis and not indicated Immunizations: Declines HPV and Meningococcal vaccines   Follow up in 1 year or sooner if indicated.    Sabino Dick, DO [facility]

## 2023-03-02 ENCOUNTER — Other Ambulatory Visit: Payer: Self-pay | Admitting: Family Medicine

## 2023-03-02 ENCOUNTER — Telehealth: Payer: Self-pay

## 2023-03-02 DIAGNOSIS — T753XXA Motion sickness, initial encounter: Secondary | ICD-10-CM

## 2023-03-02 MED ORDER — TRANSDERM-SCOP 1 MG/3DAYS TD PT72: 1.0000 | MEDICATED_PATCH | TRANSDERMAL | 0 refills | Status: AC

## 2023-03-02 NOTE — Telephone Encounter (Signed)
Patient's mother calls nurse line requesting medication for motion sickness. She states that patient is going on cruise and would like prescription for motion/sea sickness.   He is leaving for cruise on Thursday.   Pharmacy is [facility] on [address].   Please advise.   Veronda Prude, RN

## 2023-03-02 NOTE — Telephone Encounter (Signed)
Scopolamine patch sent to pharmacy

## 2023-03-25 ENCOUNTER — Ambulatory Visit (INDEPENDENT_AMBULATORY_CARE_PROVIDER_SITE_OTHER): Payer: Medicaid Other | Admitting: Student

## 2023-03-25 VITALS — BP 140/66 | HR 66 | Ht 73.0 in | Wt 214.6 lb

## 2023-03-25 DIAGNOSIS — L989 Disorder of the skin and subcutaneous tissue, unspecified: Secondary | ICD-10-CM | POA: Diagnosis present

## 2023-03-25 NOTE — Progress Notes (Signed)
    SUBJECTIVE:   CHIEF COMPLAINT / HPI:   Left leg lesion First noted 2 months ago, has remained the same size.  No needle sticks, nor insect bites.  No new exposures such as changing of detergents, no recent hiking in the woods. The lesion is not painful nor is it pruritic.  The lesion is hard.  No other lesions present on body.  No overt risk factors for skin cancer, no other lesions on skin.  OBJECTIVE:   BP (!) 140/66   Pulse 66   Ht 6\' 1"  (1.854 m)   Wt 214 lb 9.6 oz (97.3 kg)   SpO2 100%   BMI 28.31 kg/m    General: NAD, pleasant Resp: Normal work of breathing on room air Skin: Warm and dry. Left lower leg: Firm, non-tender, non-fluctuant raised papule on left calf. No erythema, no dimpling, no drainage. Hyperpigmented.    ASSESSMENT/PLAN:   Skin lesion of left leg Non-symptomatic papule of left leg. Firm, not growing. Differential includes: Dermatofibroma, ingrown hair, nevus. Low concern for malignancy.  - CTM - Follow-up PRN if symptoms change or lesion grows.   Tiffany Kocher, DO [facility]

## 2023-03-25 NOTE — Patient Instructions (Signed)
It was great to see you! Thank you for allowing me to participate in your care!   I recommend that you always bring your medications to each appointment as this makes it easy to ensure we are on the correct medications and helps [place] not miss when refills are needed.  Our plans for today:  - Follow-up as needed   Take care and seek immediate care sooner if you develop any concerns. Please remember to show up 15 minutes before your scheduled appointment time!  Tiffany Kocher, DO [facility]

## 2023-10-07 ENCOUNTER — Encounter: Payer: Self-pay | Admitting: Emergency Medicine

## 2023-10-07 ENCOUNTER — Ambulatory Visit
Admission: EM | Admit: 2023-10-07 | Discharge: 2023-10-07 | Disposition: A | Discharge status: Home / Self Care | Payer: Self-pay | Attending: Family Medicine | Admitting: Family Medicine

## 2023-10-07 DIAGNOSIS — R509 Fever, unspecified: Secondary | ICD-10-CM

## 2023-10-07 DIAGNOSIS — J069 Acute upper respiratory infection, unspecified: Secondary | ICD-10-CM

## 2023-10-07 DIAGNOSIS — R112 Nausea with vomiting, unspecified: Secondary | ICD-10-CM

## 2023-10-07 LAB — POC COVID19/FLU A&B COMBO
Covid Antigen, POC: NEGATIVE
Influenza A Antigen, POC: NEGATIVE
Influenza B Antigen, POC: NEGATIVE

## 2023-10-07 MED ORDER — PROMETHAZINE-DM 6.25-15 MG/5ML PO SYRP: 5.0000 mL | ORAL_SOLUTION | Freq: Four times a day (QID) | ORAL | 0 refills | Status: AC | PRN

## 2023-10-07 MED ORDER — ONDANSETRON 4 MG PO TBDP: 4.0000 mg | ORAL_TABLET | Freq: Three times a day (TID) | ORAL | 0 refills | Status: AC | PRN

## 2023-10-07 MED ORDER — FLUTICASONE PROPIONATE 50 MCG/ACT NA SUSP: 1.0000 | Freq: Two times a day (BID) | NASAL | 2 refills | Status: AC

## 2023-10-07 NOTE — ED Triage Notes (Signed)
 Vomiting and diarrhea since last night.  Cough and fever since yesterday.

## 2023-10-07 NOTE — ED Provider Notes (Signed)
 [facility]    CSN: [id number] Arrival date & time: 10/07/23  1550      History   Chief Complaint Chief Complaint  Patient presents with   Fever   Cough   n/v/d    HPI Todd [name] is a 23 y.o. male.   Presenting today with 1 day history of cough, fever, vomiting, diarrhea, congestion.  Denies chest pain, shortness of breath, abdominal pain, diarrhea.  So far not trying anything over-the-counter for symptoms.  No known sick contacts recently.    Past Medical History:  Diagnosis Date   Asthma     Patient Active Problem List   Diagnosis Date Noted   WCC (well child check) 04/23/2015   Allergic rhinitis 04/19/2013   Mild persistent asthma 01/15/2009    Past Surgical History:  Procedure Laterality Date   LAPAROSCOPIC APPENDECTOMY N/A 06/01/2017   Procedure: APPENDECTOMY LAPAROSCOPIC;  Surgeon: Verlena Glenn, MD;  Location: [facility] OR;  Service: General;  Laterality: N/A;       Home Medications    Prior to Admission medications   Medication Sig Start Date End Date Taking? Authorizing Provider  fluticasone  (FLONASE ) 50 MCG/ACT nasal spray Place 1 spray into both nostrils 2 (two) times daily. 10/07/23  Yes Corbin Dess, PA-C  ondansetron  (ZOFRAN -ODT) 4 MG disintegrating tablet Take 1 tablet (4 mg total) by mouth every 8 (eight) hours as needed for nausea or vomiting. 10/07/23  Yes Corbin Dess, PA-C  promethazine -dextromethorphan (PROMETHAZINE -DM) 6.25-15 MG/5ML syrup Take 5 mLs by mouth 4 (four) times daily as needed. 10/07/23  Yes Corbin Dess, PA-C  albuterol  (PROAIR  HFA) 108 (90 Base) MCG/ACT inhaler INHALE 2 PUFFS INTO THE LUNGS EVERY 4 (FOUR) HOURS AS NEEDED. FOR SEVERE COUGHING 02/10/23   Espinoza, Alejandra, DO  albuterol  (PROVENTIL ) (2.5 MG/3ML) 0.083% nebulizer solution Take 3 mLs (2.5 mg total) by nebulization every 6 (six) hours as needed for wheezing or shortness of breath. 02/10/23   Karlton Overly, DO  cetirizine   (ZYRTEC  ALLERGY) 10 MG tablet Take 1 tablet (10 mg total) by mouth daily. Patient not taking: Reported on 07/02/2021 04/06/20   Avegno, Komlanvi S, FNP  hypromellose (SYSTANE OVERNIGHT THERAPY) 0.3 % GEL ophthalmic ointment Place into the left eye at bedtime as needed for dry eyes. Patient not taking: Reported on 07/02/2021 03/08/21   Wurst, [place], PA-C  ibuprofen  (ADVIL ) 600 MG tablet Take 1 tablet (600 mg total) by mouth every 6 (six) hours as needed. 08/01/21   Haviland, Julie, MD  scopolamine (TRANSDERM-SCOP) 1 MG/3DAYS Place 1 patch (1.5 mg total) onto the skin every 3 (three) days. 03/02/23   Espinoza, Alejandra, DO  Spacer/Aero-Holding Chambers (AEROCHAMBER PLUS) inhaler Use as instructed Patient not taking: Reported on 07/02/2021 08/02/13   Raina Bunting, DO    Family History Family History  Problem Relation Age of Onset   Healthy Mother    Healthy Father     Social History Social History   Tobacco Use   Smoking status: Never   Smokeless tobacco: Never  Substance Use Topics   Alcohol use: No   Drug use: No     Allergies   Patient has no known allergies.   Review of Systems Review of Systems Per HPI  Physical Exam Triage Vital Signs ED Triage Vitals  Encounter Vitals Group     BP 10/07/23 1706 128/60     Systolic BP Percentile --      Diastolic BP Percentile --  Pulse Rate 10/07/23 1706 (!) 107     Resp 10/07/23 1706 18     Temp 10/07/23 1706 99.9 F (37.7 C)     Temp Source 10/07/23 1706 Oral     SpO2 10/07/23 1706 98 %     Weight --      Height --      Head Circumference --      Peak Flow --      Pain Score 10/07/23 1707 3     Pain Loc --      Pain Education --      Exclude from Growth Chart --    No data found.  Updated Vital Signs BP 128/60 (BP Location: Right Arm)   Pulse (!) 107   Temp 99.9 F (37.7 C) (Oral)   Resp 18   SpO2 98%   Visual Acuity Right Eye Distance:   Left Eye Distance:   Bilateral Distance:    Right  Eye Near:   Left Eye Near:    Bilateral Near:     Physical Exam Vitals and nursing note reviewed.  Constitutional:      Appearance: He is well-developed.  HENT:     Head: Atraumatic.     Right Ear: External ear normal.     Left Ear: External ear normal.     Nose: Rhinorrhea present.     Mouth/Throat:     Pharynx: Posterior oropharyngeal erythema present. No oropharyngeal exudate.  Eyes:     Conjunctiva/sclera: Conjunctivae normal.     Pupils: Pupils are equal, round, and reactive to light.  Cardiovascular:     Rate and Rhythm: Normal rate and regular rhythm.  Pulmonary:     Effort: Pulmonary effort is normal. No respiratory distress.     Breath sounds: No wheezing or rales.  Abdominal:     General: Bowel sounds are normal. There is no distension.     Palpations: Abdomen is soft.     Tenderness: There is no abdominal tenderness. There is no right CVA tenderness, left CVA tenderness or guarding.  Musculoskeletal:        General: Normal range of motion.     Cervical back: Normal range of motion and neck supple.  Lymphadenopathy:     Cervical: No cervical adenopathy.  Skin:    General: Skin is warm and dry.  Neurological:     Mental Status: He is alert and oriented to person, place, and time.  Psychiatric:        Behavior: Behavior normal.      UC Treatments / Results  Labs (all labs ordered are listed, but only abnormal results are displayed) Labs Reviewed  POC COVID19/FLU A&B COMBO    EKG   Radiology No results found.  Procedures Procedures (including critical care time)  Medications Ordered in UC Medications - No data to display  Initial Impression / Assessment and Plan / UC Course  I have reviewed the triage vital signs and the nursing notes.  Pertinent labs & imaging results that were available during my care of the patient were reviewed by me and considered in my medical decision making (see chart for details).     Low-grade fever and tachycardia  in triage, otherwise vital signs reassuring.  Rapid COVID and flu negative but suspect influenza-like illness.  Treat with Zofran , Phenergan  DM, Flonase , supportive over-the-counter medications and home care.  Return for worsening symptoms.  Final Clinical Impressions(s) / UC Diagnoses   Final diagnoses:  Viral URI with cough  Fever, unspecified  Nausea and vomiting, unspecified vomiting type     Discharge Instructions      Flu and COVID test today were negative but I do suspect flu or something very similar to the flu to be causing your symptoms.  I have sent in some medications to help with your symptoms and you may take DayQuil, NyQuil, ibuprofen , Tylenol  and drink plenty of fluids.    ED Prescriptions     Medication Sig Dispense Auth. Provider   ondansetron  (ZOFRAN -ODT) 4 MG disintegrating tablet Take 1 tablet (4 mg total) by mouth every 8 (eight) hours as needed for nausea or vomiting. 20 tablet Corbin Dess, PA-C   fluticasone  (FLONASE ) 50 MCG/ACT nasal spray Place 1 spray into both nostrils 2 (two) times daily. 16 g Corbin Dess, PA-C   promethazine -dextromethorphan (PROMETHAZINE -DM) 6.25-15 MG/5ML syrup Take 5 mLs by mouth 4 (four) times daily as needed. 100 mL Corbin Dess, [place]      PDMP not reviewed this encounter.   Corbin Dess, [place] 10/07/23 1750

## 2023-10-07 NOTE — Discharge Instructions (Signed)
 Flu and COVID test today were negative but I do suspect flu or something very similar to the flu to be causing your symptoms.  I have sent in some medications to help with your symptoms and you may take DayQuil, NyQuil, ibuprofen , Tylenol  and drink plenty of fluids.
